# Patient Record
Sex: Female | Born: 1995 | Race: Black or African American | Hispanic: No | Marital: Single | Smoking: Never smoker
Health system: Southern US, Community
[De-identification: ages and names within clinical notes are randomized; demographics above are authoritative.]

---

## 2015-11-17 ENCOUNTER — Encounter (HOSPITAL_COMMUNITY): Payer: Self-pay | Admitting: Emergency Medicine

## 2015-11-17 ENCOUNTER — Emergency Department (HOSPITAL_COMMUNITY)
Admission: EM | Admit: 2015-11-17 | Discharge: 2015-11-17 | Disposition: A | Discharge status: Home / Self Care | Payer: BLUE CROSS/BLUE SHIELD | Attending: Emergency Medicine | Admitting: Emergency Medicine

## 2015-11-17 ENCOUNTER — Emergency Department (HOSPITAL_COMMUNITY): Payer: BLUE CROSS/BLUE SHIELD

## 2015-11-17 DIAGNOSIS — Y998 Other external cause status: Secondary | ICD-10-CM | POA: Diagnosis not present

## 2015-11-17 DIAGNOSIS — S8991XA Unspecified injury of right lower leg, initial encounter: Secondary | ICD-10-CM | POA: Diagnosis present

## 2015-11-17 DIAGNOSIS — Y9389 Activity, other specified: Secondary | ICD-10-CM | POA: Diagnosis not present

## 2015-11-17 DIAGNOSIS — Y92410 Unspecified street and highway as the place of occurrence of the external cause: Secondary | ICD-10-CM | POA: Insufficient documentation

## 2015-11-17 DIAGNOSIS — S8001XA Contusion of right knee, initial encounter: Secondary | ICD-10-CM | POA: Insufficient documentation

## 2015-11-17 DIAGNOSIS — M25561 Pain in right knee: Secondary | ICD-10-CM

## 2015-11-17 MED ORDER — CYCLOBENZAPRINE HCL 10 MG PO TABS: 10.0000 mg | ORAL_TABLET | Freq: Two times a day (BID) | ORAL | Status: AC | PRN

## 2015-11-17 MED ORDER — NAPROXEN 500 MG PO TABS: 500.0000 mg | ORAL_TABLET | Freq: Two times a day (BID) | ORAL | Status: AC

## 2015-11-17 MED ORDER — IBUPROFEN 800 MG PO TABS
800.0000 mg | ORAL_TABLET | Freq: Once | ORAL | Status: AC
  Administered 2015-11-17: 800 mg via ORAL
  Filled 2015-11-17: qty 1

## 2015-11-17 MED ORDER — OXYCODONE-ACETAMINOPHEN 5-325 MG PO TABS
1.0000 | ORAL_TABLET | Freq: Once | ORAL | Status: AC
  Administered 2015-11-17: 1 via ORAL
  Filled 2015-11-17: qty 1
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
